# Patient Record
Sex: Female | Born: 1995 | Hispanic: Yes | Marital: Single | Smoking: Never smoker
Health system: Southern US, Community
[De-identification: ages and names within clinical notes are randomized; demographics above are authoritative.]

## PROBLEM LIST (undated history)

## (undated) DIAGNOSIS — J45909 Unspecified asthma, uncomplicated: Secondary | ICD-10-CM

---

## 2016-07-09 ENCOUNTER — Emergency Department (HOSPITAL_COMMUNITY)
Admission: EM | Admit: 2016-07-09 | Discharge: 2016-07-09 | Disposition: A | Discharge status: Home / Self Care | Payer: Self-pay | Attending: Emergency Medicine | Admitting: Emergency Medicine

## 2016-07-09 ENCOUNTER — Emergency Department (HOSPITAL_COMMUNITY): Payer: Self-pay

## 2016-07-09 ENCOUNTER — Encounter (HOSPITAL_COMMUNITY): Payer: Self-pay | Admitting: Neurology

## 2016-07-09 DIAGNOSIS — R0602 Shortness of breath: Secondary | ICD-10-CM | POA: Insufficient documentation

## 2016-07-09 DIAGNOSIS — J45909 Unspecified asthma, uncomplicated: Secondary | ICD-10-CM | POA: Insufficient documentation

## 2016-07-09 DIAGNOSIS — J069 Acute upper respiratory infection, unspecified: Secondary | ICD-10-CM | POA: Insufficient documentation

## 2016-07-09 HISTORY — DX: Unspecified asthma, uncomplicated: J45.909

## 2016-07-09 MED ORDER — IPRATROPIUM-ALBUTEROL 0.5-2.5 (3) MG/3ML IN SOLN
3.0000 mL | Freq: Once | RESPIRATORY_TRACT | Status: AC
  Administered 2016-07-09: 3 mL via RESPIRATORY_TRACT
  Filled 2016-07-09: qty 3

## 2016-07-09 MED ORDER — ALBUTEROL SULFATE HFA 108 (90 BASE) MCG/ACT IN AERS
1.0000 | INHALATION_SPRAY | Freq: Once | RESPIRATORY_TRACT | Status: AC
  Administered 2016-07-09: 2 via RESPIRATORY_TRACT
  Filled 2016-07-09: qty 6.7

## 2016-07-09 MED ORDER — IBUPROFEN 800 MG PO TABS
800.0000 mg | ORAL_TABLET | Freq: Once | ORAL | Status: AC
  Administered 2016-07-09: 800 mg via ORAL
  Filled 2016-07-09: qty 1

## 2016-07-09 NOTE — ED Triage Notes (Signed)
Pt reports asthma exacerbation, triggered by cough and runny nose. Moved here from [place] in September has been out of her inhaler and nebs.

## 2016-07-09 NOTE — ED Provider Notes (Signed)
[facility]-EMERGENCY DEPT Provider Note   CSN: [id number] Arrival date & time: 07/09/16  [id number]  By signing my name below, I, Karen Velazquez, attest that this documentation has been prepared under the direction and in the presence of [facility] Nevares, PA-C. Electronically Signed: Angelene GiovanniEmmanuella Velazquez, ED Scribe. 07/09/16. 10:31 AM.   History   Chief Complaint Chief Complaint  Patient presents with  . Asthma    HPI Comments: Karen Velazquez is a 20 y.o. female who presents to the Emergency Department complaining of gradual onset, persistent moderate shortness of breath consistent with her asthma exacerbation onset last night. She reports associated non-productive cough, nasal congestion with mild clear rhinorrhea, and sore throat onset yesterday. She notes that she is also experiencing chest tightness with chest wall pain and mild dizziness which is not consistent with her usual asthma exacerbations. No alleviating factors noted. She states that she has tried OTC cough medications with minimal relief. She adds that she recently moved from [place] and she no longer has her inhaler or home nebulizer treatments. Pt has an allergy to Penicillins. She reports sick contacts at home with "the flu". She denies any known fevers, chills, nausea, vomiting, LOC, or any other symptoms.   The history is provided by the patient. No language interpreter was used.    Past Medical History:  Diagnosis Date  . Asthma     There are no active problems to display for this patient.   History reviewed. No pertinent surgical history.  OB History    No data available       Home Medications    Prior to Admission medications   Not on File    Family History No family history on file.  Social History Social History  Substance Use Topics  . Smoking status: Never Smoker  . Smokeless tobacco: Never Used  . Alcohol use No     Allergies   Penicillins   Review of Systems Review of Systems  Constitutional:  Negative for chills and fever.  Respiratory: Positive for cough, chest tightness and shortness of breath.   Gastrointestinal: Negative for nausea and vomiting.  Neurological: Positive for dizziness. Negative for syncope.     Physical Exam Updated Vital Signs BP 129/66 (BP Location: Right Arm)   Pulse 112   Temp 99.9 F (37.7 C) (Oral)   Resp 20   Ht 5\' 7"  (1.702 m)   LMP 07/06/2016   SpO2 100%   Physical Exam  Constitutional: She is oriented to person, place, and time. She appears well-developed and well-nourished. No distress.  HENT:  Head: Normocephalic and atraumatic.  Mouth/Throat: Oropharynx is clear and moist. No oropharyngeal exudate.  Cardiovascular: Normal rate, regular rhythm and normal heart sounds.   No murmur heard. Pulmonary/Chest: Breath sounds normal. No respiratory distress. She has no wheezes. She has no rales. She exhibits tenderness.  Mildly increased effort in breathing. lungs are CTA bilaterally -  no wheezes or crackles.   Abdominal: Soft. She exhibits no distension. There is no tenderness.  Musculoskeletal: Normal range of motion.  Neurological: She is alert and oriented to person, place, and time.  Skin: Skin is warm and dry.  Nursing note and vitals reviewed.    ED Treatments / Results  DIAGNOSTIC STUDIES: Oxygen Saturation is 100% on RA, normal by my interpretation.    COORDINATION OF CARE: 10:28 AM- Pt advised of plan for treatment and pt agrees. Pt will receive chest x-ray for further evaluation.    Labs (all labs ordered are listed,  but only abnormal results are displayed) Labs Reviewed - No data to display  EKG  EKG Interpretation None       Radiology No results found.  Procedures Procedures (including critical care time)  Medications Ordered in ED Medications - No data to display   Initial Impression / Assessment and Plan / ED Course  Karen SauerJaime Zachory Mangual, PA-C has reviewed the triage vital signs and the nursing notes.  Pertinent  labs & imaging results that were available during my care of the patient were reviewed by me and considered in my medical decision making (see chart for details).  Clinical Course    Karen Velazquez is a 20 y.o. female who presents to ED for cough, congestion, shortness of breath and muscle aches. Hx of asthma, however lungs are CTA bilaterally with no w/r/r. Patient out of home nebs/inhalers and requesting breathing treatment. Duoneb given and patient re-evaluated: feels much improved. CXR negative. Symptoms seem more c/w viral URI than with asthma exacerbation, especially given multiple sick contacts at home as well. Albuterol inhaler refilled. Symptomatic home care instructions discussed. PCP follow up encouraged. Reasons to return to ED discussed and all questions answered.     Final Clinical Impressions(s) / ED Diagnoses   Final diagnoses:  None    New Prescriptions New Prescriptions   No medications on file   I personally performed the services described in this documentation, which was scribed in my presence. The recorded information has been reviewed and is accurate.    [facility] Pilcher Tupac Jeffus, PA-C 07/09/16 1234    Jerelyn ScottMartha Linker, MD 07/09/16 [phone]

## 2016-07-09 NOTE — Discharge Instructions (Signed)
1. Medications: use inhaler as needed for shortness of breath, alternate between tylenol and/or ibuprofen as needed for fevers, continue usual home medications 2. Treatment: rest, drink plenty of fluids  3. Follow Up: Please follow up with your primary doctor for discussion of your diagnoses and further evaluation after today's visit if symptoms persist longer than 10 days; Return to the ER for difficulty breathing, high fevers, difficulty breathing or other concerning symptoms.

## 2017-10-15 IMAGING — DX DG CHEST 2V
2 series · 2 of 2 positions shown · non-contrast
Comparison: None.

CLINICAL DATA: Pt has SOB regularly due to asthma but since last
night it has been much worse - cough for 2 days as well, fever today
- no known heart issues, nonsmoker

EXAM:
CHEST  2 VIEW

[chest pa]
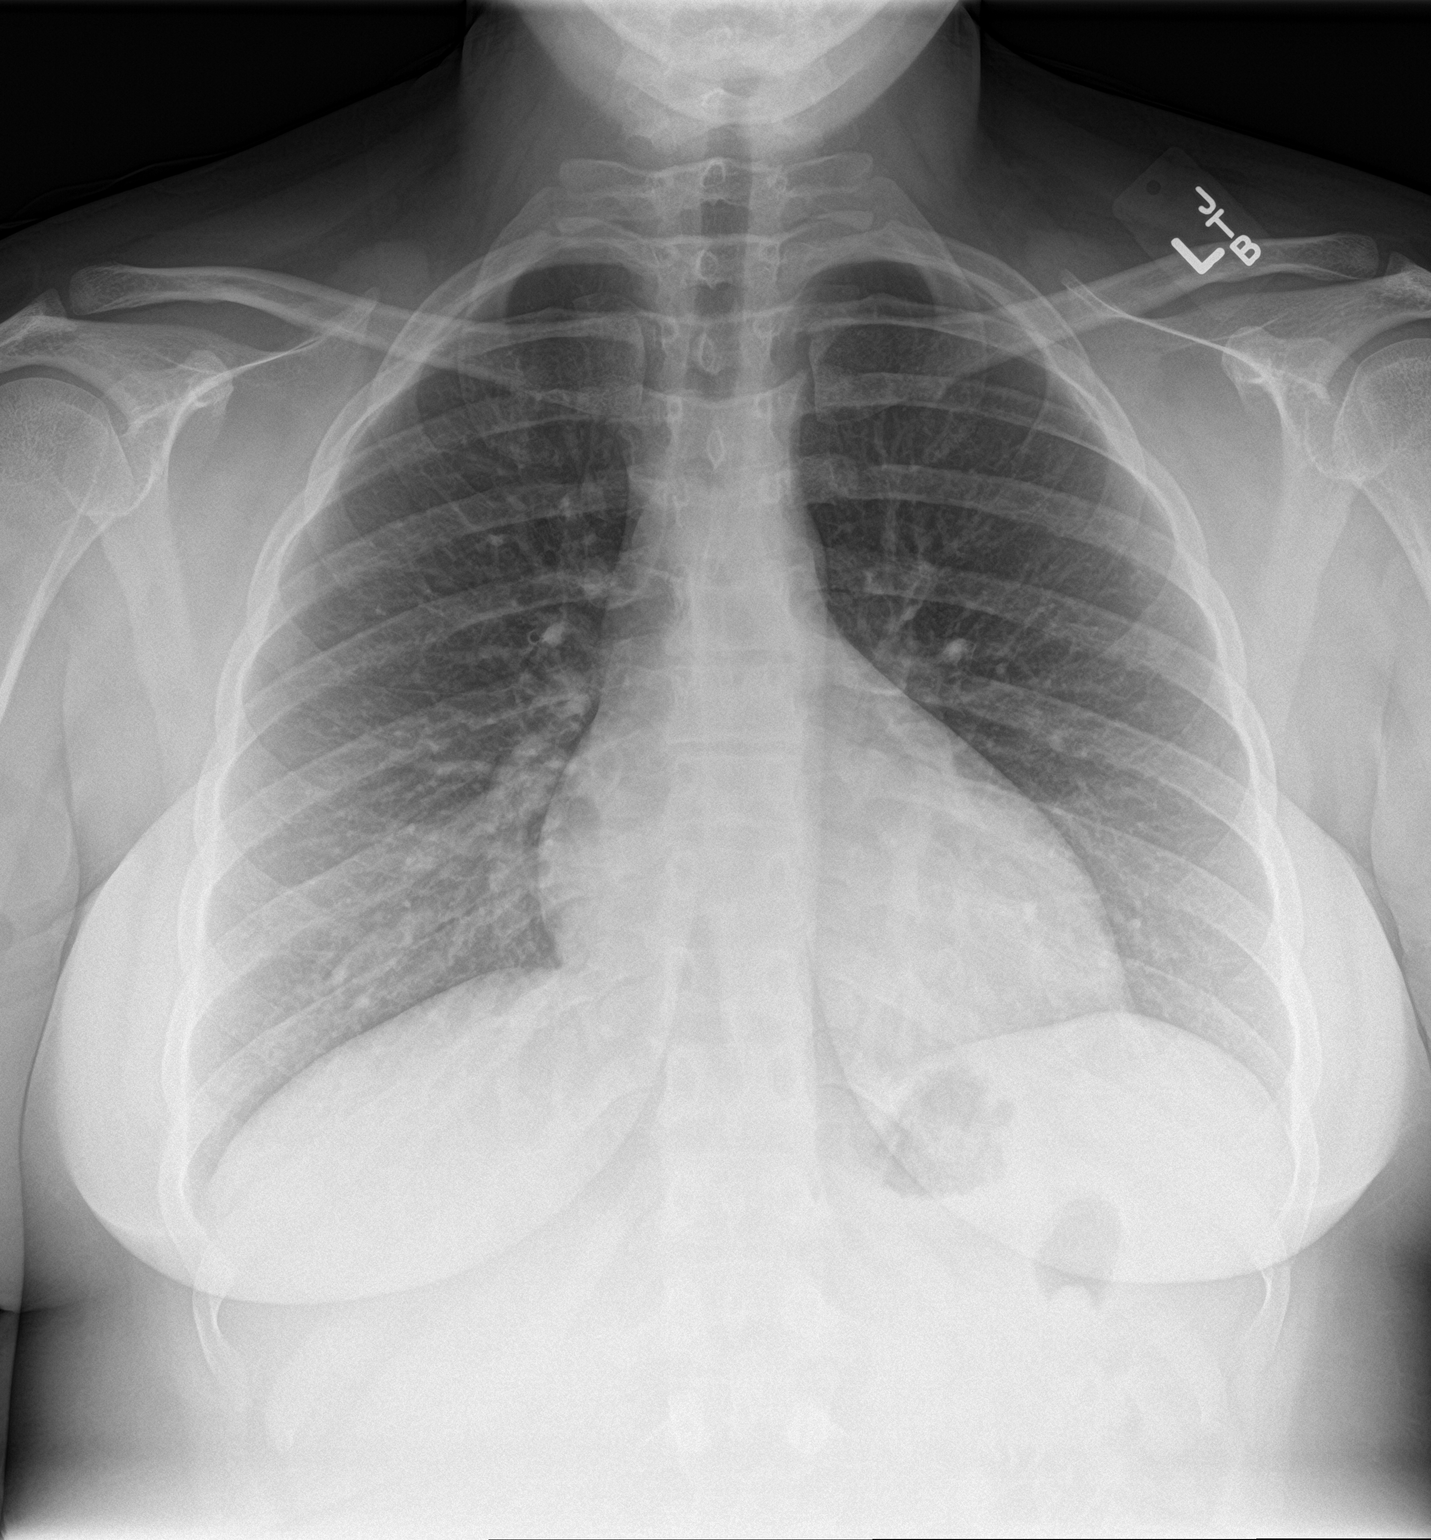

[chest lat]
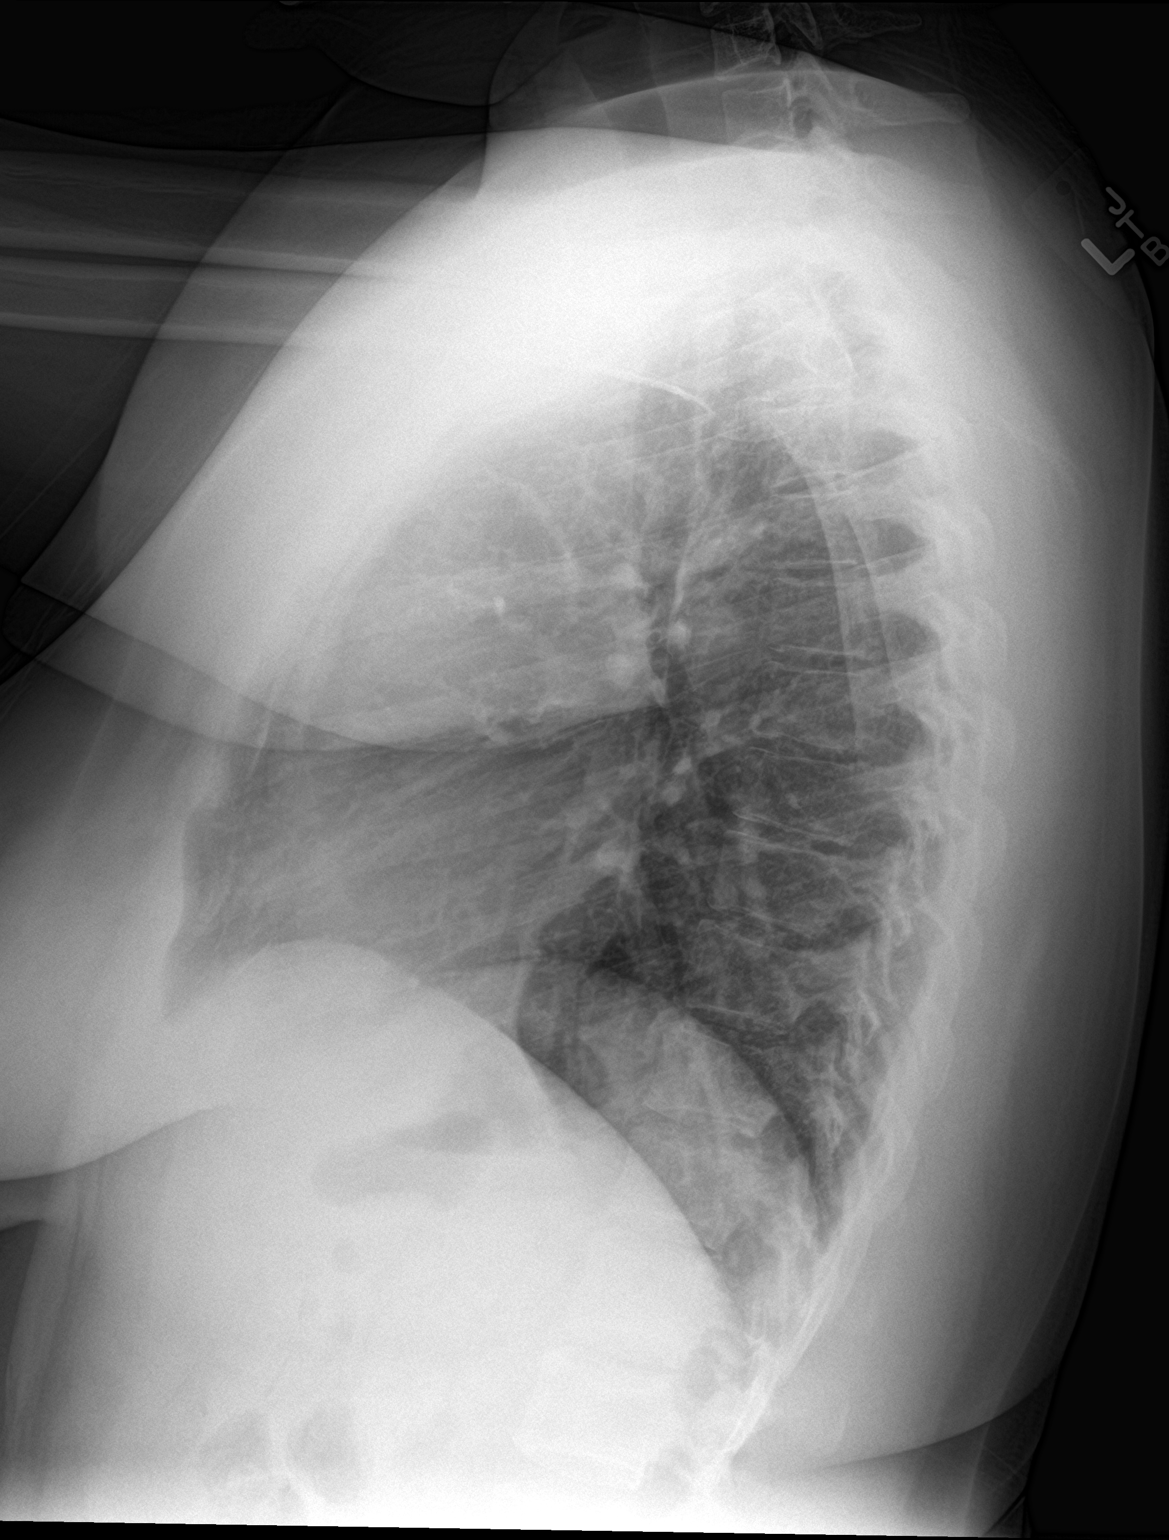

[2 of 2 positions shown; findings below may reference images not displayed]

FINDINGS: Upper normal heart size. Mediastinal contour normal. The lungs
appear clear. No pleural effusion. No significant thoracic bony
abnormality.
IMPRESSION: No active cardiopulmonary disease.
